# Patient Record
Sex: Female | Born: 1955 | Race: White | Hispanic: No | Marital: Married | Smoking: Former smoker
Health system: Southern US, Community
[De-identification: ages and names within clinical notes are randomized; demographics above are authoritative.]

## PROBLEM LIST (undated history)

## (undated) DIAGNOSIS — G4730 Sleep apnea, unspecified: Secondary | ICD-10-CM

## (undated) DIAGNOSIS — M1990 Unspecified osteoarthritis, unspecified site: Secondary | ICD-10-CM

## (undated) DIAGNOSIS — E119 Type 2 diabetes mellitus without complications: Secondary | ICD-10-CM

## (undated) DIAGNOSIS — G2581 Restless legs syndrome: Secondary | ICD-10-CM

## (undated) DIAGNOSIS — F32A Depression, unspecified: Secondary | ICD-10-CM

## (undated) DIAGNOSIS — I10 Essential (primary) hypertension: Secondary | ICD-10-CM

## (undated) DIAGNOSIS — D649 Anemia, unspecified: Secondary | ICD-10-CM

## (undated) DIAGNOSIS — F329 Major depressive disorder, single episode, unspecified: Secondary | ICD-10-CM

## (undated) DIAGNOSIS — E785 Hyperlipidemia, unspecified: Secondary | ICD-10-CM

## (undated) DIAGNOSIS — N12 Tubulo-interstitial nephritis, not specified as acute or chronic: Secondary | ICD-10-CM

## (undated) DIAGNOSIS — K219 Gastro-esophageal reflux disease without esophagitis: Secondary | ICD-10-CM

## (undated) HISTORY — PX: ORIF HUMERUS FRACTURE: SHX2126

## (undated) HISTORY — PX: APPENDECTOMY: SHX54

## (undated) HISTORY — PX: TONSILLECTOMY: SUR1361

## (undated) HISTORY — PX: ORIF WRIST FRACTURE: SHX2133

## (undated) HISTORY — PX: COLONOSCOPY: SHX174

## (undated) HISTORY — PX: LAPAROSCOPIC GASTRIC SLEEVE RESECTION: SHX5895

---

## 2003-09-18 ENCOUNTER — Other Ambulatory Visit: Payer: Self-pay

## 2004-06-03 ENCOUNTER — Other Ambulatory Visit: Payer: Self-pay

## 2004-08-09 ENCOUNTER — Ambulatory Visit: Payer: Self-pay | Admitting: Unknown Physician Specialty

## 2005-02-23 ENCOUNTER — Ambulatory Visit: Payer: Self-pay | Admitting: Internal Medicine

## 2005-03-12 ENCOUNTER — Ambulatory Visit: Payer: Self-pay | Admitting: Internal Medicine

## 2005-04-12 ENCOUNTER — Ambulatory Visit: Payer: Self-pay | Admitting: Internal Medicine

## 2005-06-16 ENCOUNTER — Ambulatory Visit: Payer: Self-pay

## 2006-07-03 ENCOUNTER — Ambulatory Visit: Payer: Self-pay | Admitting: Obstetrics and Gynecology

## 2007-07-05 ENCOUNTER — Ambulatory Visit: Payer: Self-pay | Admitting: Obstetrics and Gynecology

## 2008-01-11 ENCOUNTER — Ambulatory Visit: Payer: Self-pay | Admitting: Obstetrics and Gynecology

## 2008-08-11 ENCOUNTER — Ambulatory Visit: Payer: Self-pay | Admitting: Obstetrics and Gynecology

## 2009-08-13 ENCOUNTER — Ambulatory Visit: Payer: Self-pay | Admitting: Obstetrics and Gynecology

## 2010-09-09 ENCOUNTER — Ambulatory Visit: Payer: Self-pay | Admitting: Obstetrics and Gynecology

## 2010-09-17 ENCOUNTER — Ambulatory Visit: Payer: Self-pay | Admitting: Specialist

## 2010-10-01 ENCOUNTER — Ambulatory Visit: Payer: Self-pay | Admitting: Specialist

## 2010-10-13 ENCOUNTER — Ambulatory Visit: Payer: Self-pay | Admitting: Specialist

## 2010-11-05 ENCOUNTER — Ambulatory Visit: Payer: Self-pay | Admitting: Specialist

## 2010-11-16 ENCOUNTER — Inpatient Hospital Stay: Payer: Self-pay | Admitting: Specialist

## 2010-11-18 LAB — PATHOLOGY REPORT

## 2010-11-29 ENCOUNTER — Ambulatory Visit: Payer: Self-pay | Admitting: Specialist

## 2010-12-12 ENCOUNTER — Ambulatory Visit: Payer: Self-pay | Admitting: Specialist

## 2011-08-08 ENCOUNTER — Ambulatory Visit: Payer: Self-pay | Admitting: Unknown Physician Specialty

## 2011-09-14 ENCOUNTER — Ambulatory Visit: Payer: Self-pay

## 2012-10-17 ENCOUNTER — Ambulatory Visit: Payer: Self-pay | Admitting: Obstetrics and Gynecology

## 2013-11-06 ENCOUNTER — Ambulatory Visit: Payer: Self-pay | Admitting: Obstetrics and Gynecology

## 2014-12-01 ENCOUNTER — Ambulatory Visit: Payer: Self-pay | Admitting: Obstetrics and Gynecology

## 2015-05-28 ENCOUNTER — Encounter: Payer: Self-pay | Admitting: *Deleted

## 2015-05-28 ENCOUNTER — Inpatient Hospital Stay: Admission: RE | Admit: 2015-05-28 | Payer: Self-pay | Source: Ambulatory Visit

## 2015-05-28 NOTE — Patient Instructions (Addendum)
  Your procedure is scheduled on: 06-09-15 Report to [facility] 2ND FLOOR To find out your arrival time please call [phone] between 1PM - 3PM on 06-08-15  Remember: Instructions that are not followed completely may result in serious medical risk, up to and including death, or upon the discretion of your surgeon and anesthesiologist your surgery may need to be rescheduled.    _X___ 1. Do not eat food or drink liquids after midnight. No gum chewing or hard candies.     _X___ 2. No Alcohol for 24 hours before or after surgery.   ____ 3. Bring all medications with you on the day of surgery if instructed.    ____ 4. Notify your doctor if there is any change in your medical condition     (cold, fever, infections).     Do not wear jewelry, make-up, hairpins, clips or nail polish.  Do not wear lotions, powders, or perfumes. You may wear deodorant.  Do not shave 48 hours prior to surgery. Men may shave face and neck.  Do not bring valuables to the hospital.    [facility] is not responsible for any belongings or valuables.               Contacts, dentures or bridgework may not be worn into surgery.  Leave your suitcase in the car. After surgery it may be brought to your room.  For patients admitted to the hospital, discharge time is determined by your  treatment team.   Patients discharged the day of surgery will not be allowed to drive home.   Please read over the following fact sheets that you were given:     _X___ Take these medicines the morning of surgery with A SIP OF WATER:    1. LISINOPRIL  2. METOPROLOL  3.   4.  5.  6.  ____ Fleet Enema (as directed)   ____ Use CHG Soap as directed  ____ Use inhalers on the day of surgery  __X__ Stop metformin 2 days prior to surgery-LAST DOSE 06-06-15 SAT    ____ Take 1/2 of usual insulin dose the night before surgery and none on the morning of surgery.   __X__ Stop Coumadin/Plavix/aspirin-STOP ASA 7 DAYS  PRIOR  __X__ Stop Anti-inflammatories-STOP MELOXICAM 7 DAYS PRIOR-NO NSAIDS OR ASA PRODUCTS-TYLENOL OK   __X__ Stop supplements until after surgery-PT ALREADY STOPPED FISH OIL   ____ Bring C-Pap to the hospital.

## 2015-06-09 ENCOUNTER — Encounter: Payer: Self-pay | Admitting: *Deleted

## 2015-06-09 ENCOUNTER — Ambulatory Visit: Payer: BLUE CROSS/BLUE SHIELD | Admitting: Anesthesiology

## 2015-06-09 ENCOUNTER — Encounter
Admission: RE | Disposition: A | Discharge status: Home / Self Care | Payer: Self-pay | Source: Ambulatory Visit | Attending: Surgery

## 2015-06-09 ENCOUNTER — Ambulatory Visit
Admission: RE | Admit: 2015-06-09 | Discharge: 2015-06-09 | Disposition: A | Discharge status: Home / Self Care | Payer: BLUE CROSS/BLUE SHIELD | Source: Ambulatory Visit | Attending: Surgery | Admitting: Surgery

## 2015-06-09 DIAGNOSIS — K219 Gastro-esophageal reflux disease without esophagitis: Secondary | ICD-10-CM | POA: Diagnosis not present

## 2015-06-09 DIAGNOSIS — E119 Type 2 diabetes mellitus without complications: Secondary | ICD-10-CM | POA: Diagnosis not present

## 2015-06-09 DIAGNOSIS — M75102 Unspecified rotator cuff tear or rupture of left shoulder, not specified as traumatic: Secondary | ICD-10-CM | POA: Insufficient documentation

## 2015-06-09 DIAGNOSIS — G4730 Sleep apnea, unspecified: Secondary | ICD-10-CM | POA: Insufficient documentation

## 2015-06-09 DIAGNOSIS — Z8249 Family history of ischemic heart disease and other diseases of the circulatory system: Secondary | ICD-10-CM | POA: Diagnosis not present

## 2015-06-09 DIAGNOSIS — D649 Anemia, unspecified: Secondary | ICD-10-CM | POA: Insufficient documentation

## 2015-06-09 DIAGNOSIS — F329 Major depressive disorder, single episode, unspecified: Secondary | ICD-10-CM | POA: Diagnosis not present

## 2015-06-09 DIAGNOSIS — M7542 Impingement syndrome of left shoulder: Secondary | ICD-10-CM | POA: Diagnosis present

## 2015-06-09 DIAGNOSIS — M170 Bilateral primary osteoarthritis of knee: Secondary | ICD-10-CM | POA: Insufficient documentation

## 2015-06-09 DIAGNOSIS — Z79899 Other long term (current) drug therapy: Secondary | ICD-10-CM | POA: Diagnosis not present

## 2015-06-09 DIAGNOSIS — Z888 Allergy status to other drugs, medicaments and biological substances status: Secondary | ICD-10-CM | POA: Insufficient documentation

## 2015-06-09 DIAGNOSIS — Z800 Family history of malignant neoplasm of digestive organs: Secondary | ICD-10-CM | POA: Insufficient documentation

## 2015-06-09 DIAGNOSIS — Z833 Family history of diabetes mellitus: Secondary | ICD-10-CM | POA: Diagnosis not present

## 2015-06-09 DIAGNOSIS — Z87891 Personal history of nicotine dependence: Secondary | ICD-10-CM | POA: Insufficient documentation

## 2015-06-09 DIAGNOSIS — I10 Essential (primary) hypertension: Secondary | ICD-10-CM | POA: Insufficient documentation

## 2015-06-09 DIAGNOSIS — Z9884 Bariatric surgery status: Secondary | ICD-10-CM | POA: Insufficient documentation

## 2015-06-09 DIAGNOSIS — Z7982 Long term (current) use of aspirin: Secondary | ICD-10-CM | POA: Diagnosis not present

## 2015-06-09 DIAGNOSIS — E785 Hyperlipidemia, unspecified: Secondary | ICD-10-CM | POA: Diagnosis not present

## 2015-06-09 HISTORY — DX: Unspecified osteoarthritis, unspecified site: M19.90

## 2015-06-09 HISTORY — DX: Sleep apnea, unspecified: G47.30

## 2015-06-09 HISTORY — DX: Type 2 diabetes mellitus without complications: E11.9

## 2015-06-09 HISTORY — DX: Gastro-esophageal reflux disease without esophagitis: K21.9

## 2015-06-09 HISTORY — DX: Anemia, unspecified: D64.9

## 2015-06-09 HISTORY — DX: Hyperlipidemia, unspecified: E78.5

## 2015-06-09 HISTORY — DX: Essential (primary) hypertension: I10

## 2015-06-09 HISTORY — DX: Depression, unspecified: F32.A

## 2015-06-09 HISTORY — DX: Tubulo-interstitial nephritis, not specified as acute or chronic: N12

## 2015-06-09 HISTORY — DX: Major depressive disorder, single episode, unspecified: F32.9

## 2015-06-09 HISTORY — DX: Restless legs syndrome: G25.81

## 2015-06-09 HISTORY — PX: SHOULDER ARTHROSCOPY WITH OPEN ROTATOR CUFF REPAIR: SHX6092

## 2015-06-09 LAB — GLUCOSE, CAPILLARY
Glucose-Capillary: 83 mg/dL (ref 65–99)
Glucose-Capillary: 84 mg/dL (ref 65–99)

## 2015-06-09 SURGERY — ARTHROSCOPY, SHOULDER WITH REPAIR, ROTATOR CUFF, OPEN
Anesthesia: General | Site: Shoulder | Laterality: Left

## 2015-06-09 MED ORDER — LIDOCAINE HCL (CARDIAC) 20 MG/ML IV SOLN
INTRAVENOUS | Status: DC | PRN
  Administered 2015-06-09: 100 mg via INTRAVENOUS

## 2015-06-09 MED ORDER — FENTANYL CITRATE (PF) 100 MCG/2ML IJ SOLN
INTRAMUSCULAR | Status: DC | PRN
  Administered 2015-06-09: 150 ug via INTRAVENOUS
  Administered 2015-06-09: 100 ug via INTRAVENOUS

## 2015-06-09 MED ORDER — GLYCOPYRROLATE 0.2 MG/ML IJ SOLN
INTRAMUSCULAR | Status: DC | PRN
  Administered 2015-06-09: .5 mg via INTRAVENOUS

## 2015-06-09 MED ORDER — FAMOTIDINE 20 MG PO TABS
20.0000 mg | ORAL_TABLET | Freq: Once | ORAL | Status: AC
  Administered 2015-06-09: 20 mg via ORAL

## 2015-06-09 MED ORDER — LIDOCAINE HCL (PF) 1 % IJ SOLN
INTRAMUSCULAR | Status: AC
  Administered 2015-06-09: 5 mL
  Filled 2015-06-09: qty 5

## 2015-06-09 MED ORDER — ONDANSETRON HCL 4 MG/2ML IJ SOLN
4.0000 mg | Freq: Once | INTRAMUSCULAR | Status: AC
  Administered 2015-06-09: 4 mg via INTRAVENOUS

## 2015-06-09 MED ORDER — BUPIVACAINE-EPINEPHRINE 0.5% -1:200000 IJ SOLN
INTRAMUSCULAR | Status: DC | PRN
Start: 2015-06-09 — End: 2015-06-09
  Administered 2015-06-09: 20 mL

## 2015-06-09 MED ORDER — DEXAMETHASONE SODIUM PHOSPHATE 4 MG/ML IJ SOLN
INTRAMUSCULAR | Status: DC | PRN
  Administered 2015-06-09: 5 mg via INTRAVENOUS

## 2015-06-09 MED ORDER — PROPOFOL 10 MG/ML IV BOLUS
INTRAVENOUS | Status: DC | PRN
  Administered 2015-06-09: 50 mg via INTRAVENOUS

## 2015-06-09 MED ORDER — PROMETHAZINE HCL 25 MG/ML IJ SOLN: 6.2500 mg | INTRAMUSCULAR | Status: DC | PRN

## 2015-06-09 MED ORDER — ROPIVACAINE HCL 5 MG/ML IJ SOLN
INTRAMUSCULAR | Status: AC
  Administered 2015-06-09: 30 mL via EPIDURAL
  Filled 2015-06-09: qty 20

## 2015-06-09 MED ORDER — FENTANYL CITRATE (PF) 100 MCG/2ML IJ SOLN: 25.0000 ug | INTRAMUSCULAR | Status: DC | PRN

## 2015-06-09 MED ORDER — MIDAZOLAM HCL 2 MG/2ML IJ SOLN
2.0000 mg | Freq: Once | INTRAMUSCULAR | Status: AC
  Administered 2015-06-09: 2 mg via INTRAVENOUS

## 2015-06-09 MED ORDER — ONDANSETRON HCL 4 MG/2ML IJ SOLN
INTRAMUSCULAR | Status: AC
Start: 2015-06-09 — End: 2015-06-09
  Administered 2015-06-09: 4 mg via INTRAVENOUS
  Filled 2015-06-09: qty 2

## 2015-06-09 MED ORDER — OXYCODONE HCL 5 MG PO TABS: 5.0000 mg | ORAL_TABLET | ORAL | Status: AC | PRN

## 2015-06-09 MED ORDER — OXYCODONE HCL 5 MG PO TABS
5.0000 mg | ORAL_TABLET | Freq: Once | ORAL | Status: AC
Start: 2015-06-09 — End: 2015-06-09
  Administered 2015-06-09: 5 mg via ORAL

## 2015-06-09 MED ORDER — SODIUM CHLORIDE 0.9 % IV SOLN
INTRAVENOUS | Status: DC
Start: 2015-06-09 — End: 2015-06-09
  Administered 2015-06-09 (×2): via INTRAVENOUS

## 2015-06-09 MED ORDER — MIDAZOLAM HCL 5 MG/5ML IJ SOLN
INTRAMUSCULAR | Status: AC
Start: 2015-06-09 — End: 2015-06-09
  Administered 2015-06-09: 2 mg via INTRAVENOUS
  Filled 2015-06-09: qty 5

## 2015-06-09 MED ORDER — DEXTROSE 5 % IV SOLN
10.0000 mg | INTRAVENOUS | Status: DC | PRN
  Administered 2015-06-09: 30 ug/min via INTRAVENOUS

## 2015-06-09 MED ORDER — MIDAZOLAM HCL 2 MG/2ML IJ SOLN
INTRAMUSCULAR | Status: DC | PRN
  Administered 2015-06-09: 2 mg via INTRAVENOUS

## 2015-06-09 MED ORDER — CEFAZOLIN SODIUM-DEXTROSE 2-3 GM-% IV SOLR
2.0000 g | Freq: Once | INTRAVENOUS | Status: AC
  Administered 2015-06-09: 2 g via INTRAVENOUS

## 2015-06-09 MED ORDER — PHENYLEPHRINE HCL 10 MG/ML IJ SOLN
INTRAMUSCULAR | Status: DC | PRN
  Administered 2015-06-09: 200 ug via INTRAVENOUS
  Administered 2015-06-09: 150 ug via INTRAVENOUS
  Administered 2015-06-09: 100 ug via INTRAVENOUS

## 2015-06-09 MED ORDER — ROCURONIUM BROMIDE 100 MG/10ML IV SOLN
INTRAVENOUS | Status: DC | PRN
  Administered 2015-06-09: 50 mg via INTRAVENOUS

## 2015-06-09 MED ORDER — ONDANSETRON HCL 4 MG/2ML IJ SOLN
INTRAMUSCULAR | Status: DC | PRN
  Administered 2015-06-09: 4 mg via INTRAVENOUS

## 2015-06-09 MED ORDER — ACETAMINOPHEN 10 MG/ML IV SOLN
INTRAVENOUS | Status: DC | PRN
  Administered 2015-06-09: 1000 mg via INTRAVENOUS

## 2015-06-09 MED ORDER — EPINEPHRINE HCL 1 MG/ML IJ SOLN
INTRAMUSCULAR | Status: DC | PRN
  Administered 2015-06-09: 2 mL

## 2015-06-09 MED ORDER — NEOSTIGMINE METHYLSULFATE 10 MG/10ML IV SOLN
INTRAVENOUS | Status: DC | PRN
  Administered 2015-06-09: 3 mg via INTRAVENOUS

## 2015-06-09 SURGICAL SUPPLY — 47 items
ANCHOR JUGGERKNOT WTAP NDL 2.9 (Anchor) ×6 IMPLANT
BIT DRILL JUGRKNT W/NDL BIT2.9 (DRILL) ×1 IMPLANT
BLADE FULL RADIUS 3.5 (BLADE) ×3 IMPLANT
BLADE SHAVER 4.5X7 STR FR (MISCELLANEOUS) IMPLANT
BUR ACROMIONIZER 4.0 (BURR) ×3 IMPLANT
BUR BR 5.5 WIDE MOUTH (BURR) IMPLANT
CANNULA 8.5X75 THRED (CANNULA) ×3 IMPLANT
CANNULA SHAVER 8MMX76MM (CANNULA) ×6 IMPLANT
CHLORAPREP W/TINT 26ML (MISCELLANEOUS) ×6 IMPLANT
COVER MAYO STAND STRL (DRAPES) ×3 IMPLANT
DRAPE IMP U-DRAPE 54X76 (DRAPES) ×3 IMPLANT
DRAPE SURG 17X11 SM STRL (DRAPES) ×3 IMPLANT
DRILL JUGGERKNOT W/NDL BIT 2.9 (DRILL) ×3
DRSG OPSITE POSTOP 4X8 (GAUZE/BANDAGES/DRESSINGS) IMPLANT
GAUZE PETRO XEROFOAM 1X8 (MISCELLANEOUS) ×3 IMPLANT
GAUZE SPONGE 4X4 12PLY STRL (GAUZE/BANDAGES/DRESSINGS) ×3 IMPLANT
GLOVE BIO SURGEON STRL SZ7.5 (GLOVE) ×6 IMPLANT
GLOVE BIO SURGEON STRL SZ8 (GLOVE) ×6 IMPLANT
GLOVE BIOGEL PI IND STRL 8 (GLOVE) ×1 IMPLANT
GLOVE BIOGEL PI INDICATOR 8 (GLOVE) ×2
GLOVE INDICATOR 8.0 STRL GRN (GLOVE) ×3 IMPLANT
GOWN STRL REUS W/ TWL LRG LVL3 (GOWN DISPOSABLE) ×2 IMPLANT
GOWN STRL REUS W/ TWL XL LVL3 (GOWN DISPOSABLE) ×1 IMPLANT
GOWN STRL REUS W/TWL LRG LVL3 (GOWN DISPOSABLE) ×4
GOWN STRL REUS W/TWL XL LVL3 (GOWN DISPOSABLE) ×2
GRASPER SUT 15 45D LOW PRO (SUTURE) IMPLANT
IV LACTATED RINGER IRRG 3000ML (IV SOLUTION) ×4
IV LR IRRIG 3000ML ARTHROMATIC (IV SOLUTION) ×2 IMPLANT
MANIFOLD NEPTUNE II (INSTRUMENTS) ×3 IMPLANT
MASK FACE SPIDER DISP (MASK) ×3 IMPLANT
MAT BLUE FLOOR 46X72 FLO (MISCELLANEOUS) ×3 IMPLANT
NEEDLE REVERSE CUT 1/2 CRC (NEEDLE) ×3 IMPLANT
PACK ARTHROSCOPY SHOULDER (MISCELLANEOUS) ×3 IMPLANT
PAD GROUND ADULT SPLIT (MISCELLANEOUS) ×3 IMPLANT
SLING ARM LRG DEEP (SOFTGOODS) ×3 IMPLANT
SLING ULTRA II LG (MISCELLANEOUS) ×3 IMPLANT
STAPLER SKIN PROX 35W (STAPLE) ×3 IMPLANT
STRAP SAFETY BODY (MISCELLANEOUS) ×3 IMPLANT
SUT ETHIBOND 0 MO6 C/R (SUTURE) ×3 IMPLANT
SUT PROLENE 4 0 PS 2 18 (SUTURE) ×3 IMPLANT
SUT VIC AB 2-0 CT1 27 (SUTURE) ×4
SUT VIC AB 2-0 CT1 TAPERPNT 27 (SUTURE) ×2 IMPLANT
TAPE MICROFOAM 4IN (TAPE) ×3 IMPLANT
TUBING ARTHRO INFLOW-ONLY STRL (TUBING) ×3 IMPLANT
TUBING CONNECTING 10 (TUBING) ×2 IMPLANT
TUBING CONNECTING 10' (TUBING) ×1
WAND HAND CNTRL MULTIVAC 90 (MISCELLANEOUS) ×3 IMPLANT

## 2015-06-09 NOTE — Anesthesia Procedure Notes (Addendum)
Anesthesia Regional Block:  Interscalene brachial plexus block  Pre-Anesthetic Checklist: ,, timeout performed, Correct Patient, Correct Site, Correct Laterality, Correct Procedure, Correct Position, site marked, Risks and benefits discussed,  Surgical consent,  Pre-op evaluation,  At surgeon's request and post-op pain management  Laterality: Left and Upper  Prep: chloraprep       Needles:  Injection technique: Single-shot  Needle Type: Echogenic Stimulator Needle     Needle Length: 5cm 5 cm Needle Gauge: 22 and 22 G    Additional Needles:  Procedures: ultrasound guided (picture in chart) Interscalene brachial plexus block Narrative:  Start time: 06/09/2015 12:25 PM End time: 06/09/2015 12:30 PM Injection made incrementally with aspirations every 5 mL.  Performed by: Personally  Anesthesiologist: Lenard Simmer   Procedure Name: Intubation Date/Time: 06/09/2015 1:26 PM Performed by: Darrol Jump Pre-anesthesia Checklist: Patient identified, Emergency Drugs available, Suction available and Patient being monitored Patient Re-evaluated:Patient Re-evaluated prior to inductionOxygen Delivery Method: Circle system utilized Preoxygenation: Pre-oxygenation with 100% oxygen Intubation Type: IV induction Laryngoscope Size: Mac and 3 Grade View: Grade I Tube type: Oral Tube size: 7.0 mm Number of attempts: 1 Placement Confirmation: ETT inserted through vocal cords under direct vision,  positive ETCO2 and breath sounds checked- equal and bilateral Secured at: 21 cm Tube secured with: Tape Dental Injury: Teeth and Oropharynx as per pre-operative assessment

## 2015-06-09 NOTE — H&P (Signed)
Paper H&P to be scanned into permanent record. H&P reviewed. No changes. Lungs are clear to auscultation. Cardiac exam demonstrates a regular rate and rhythm with no murmurs.

## 2015-06-09 NOTE — Op Note (Signed)
06/09/2015  2:56 PM  Patient:   Cassandra Schwartz  Pre-Op Diagnosis:   Impingement/tendinopathy with near full-thickness rotator cuff tear, left shoulder.  Postoperative diagnosis: Impingement/tendinopathy with near full-thickness rotator cuff tear and labral fraying, left shoulder.  Procedure: Limited arthroscopic debridement, arthroscopic subacromial decompression, and mini-open rotator cuff repair, left shoulder.  Anesthesia: General endotracheal with interscalene block placed preoperatively by the anesthesiologist.  Surgeon:   Maryagnes Amos, MD  Assistant:   Horris Latino, PA-C  Findings: As above. There was moderate fraying involving the superior and postero-superior portions of the labrum without frank detachment. The biceps tendon was in satisfactory condition. There was a near full-thickness bursal surface tear of the supraspinatus involving at least 90% of the footprint and extending into the infraspinatus with intact articular surface fibers. The articular surfaces of both the glenoid and humerus were in satisfactory condition.  Complications: None  Fluids:   1100 cc  Estimated blood loss: 10 cc  Tourniquet time: None  Drains: None  Closure: Staples   Brief clinical note: The patient is a 59 year old female who fell onto her left shoulder 5 months ago, resulting in pain and weakness of the left shoulder. The patient's symptoms have persisted despite medications, activity modification, etc. The patient's history and examination are consistent with a rotator cuff tear. These findings were confirmed by MRI scan. The patient presents at this time for definitive management of these shoulder symptoms.  Procedure: The patient underwent placement of an interscalene block by the anesthesiologist in the preoperative holding area. The patient was brought into the operating room and lain in the supine position. She underwent general endotracheal intubation and  anesthesia before being repositioned in the beach chair position using the beach chair positioner. The left shoulder and upper extremity were prepped with ChloraPrep solution before being draped sterilely. Preoperative antibiotics were administered. A timeout was performed to confirm the proper surgical site before the expected portal sites and incision site were injected with 0.5% Sensorcaine with epinephrine. A posterior portal was created and the glenohumeral joint thoroughly inspected with the findings as described above. An anterior portal was created using an outside-in technique. The labrum and rotator cuff were further probed, again confirming the above-noted findings. The areas of labral fraying superiorly were debrided back to stable margins using the full-radius resector. Some areas of reactive synovitis also were debrided. There is no evidence of any rotator cuff tear when viewed from the intra-articular side. The ArthroCare wand was inserted and used to obtain hemostasis as well as to "anneal" the labrum superiorly and anteriorly. The instruments were removed from the joint after suctioning the excess fluid.  The camera was repositioned through the posterior portal into the subacromial space. A separate lateral portal was created using an outside-in technique. The 3.5 mm full-radius resector was introduced and used to perform a subtotal bursectomy. The ArthroCare wand was then inserted and used to remove the periosteal tissue off the undersurface of the anterior third of the acromion as well as to recess the coracoacromial ligament from its attachment along the anterior and lateral margins of the acromion. The 4.0 mm acromionizing bur was introduced and used to complete the decompression by removing the undersurface of the anterior third of the acromion. The full radius resector was reintroduced to remove any residual bony debris before the ArthroCare wand was reintroduced to obtain hemostasis. The  instruments were then removed from the subacromial space after suctioning the excess fluid.  An approximately 3.5-4 cm incision was  made over the anterolateral aspect of the shoulder beginning at the anterolateral corner of the acromion and extending distally in line with the bicipital groove. This incision was carried down through the subcutaneous tissues to expose the deltoid fascia. The raphae between the anterior and middle thirds was identified and this plane developed to provide access into the subacromial space. Additional bursal tissues were debrided sharply using Metzenbaum scissors. The rotator cuff tear was readily identified. The margins were debrided sharply with a #15 blade and the exposed greater tuberosity roughened with a rongeur while keeping the articular fibers intact. The tear was repaired using two Biomet 2.9 mm JuggerKnot anchors. Several of these sutures were then brought back laterally through bone tunnels and tied over bone bridges to create a two-layer closure. Several additional #0 Ethibond interrupted sutures were placed in a side-to-side fashion to further reinforce the repair. An apparent watertight closure was obtained.  The wound was copiously irrigated with sterile saline solution before the deltoid raphae was reapproximated using 2-0 Vicryl interrupted sutures. The subcutaneous tissues were closed in two layers using 2-0 Vicryl interrupted sutures before the skin was closed using staples. The portal sites also were closed using staples. A sterile bulky dressing was applied to the shoulder before the arm was placed into a shoulder immobilizer. The patient was then awakened, extubated, and returned to the recovery room in satisfactory condition after tolerating the procedure well.

## 2015-06-09 NOTE — Anesthesia Preprocedure Evaluation (Signed)
Anesthesia Evaluation  Patient identified by MRN, date of birth, ID band Patient awake    Reviewed: Allergy & Precautions, H&P , NPO status , Patient's Chart, lab work & pertinent test results, reviewed documented beta blocker date and time   History of Anesthesia Complications Negative for: history of anesthetic complications  Airway Mallampati: III  TM Distance: >3 FB Neck ROM: full    Dental no notable dental hx. (+) Caps, Teeth Intact Permanent bridge on the bottom left:   Pulmonary neg shortness of breath, sleep apnea (history of, but not rechecked since 90 pound weight loss) , neg COPD, neg recent URI, former smoker,    Pulmonary exam normal breath sounds clear to auscultation       Cardiovascular Exercise Tolerance: Good hypertension, On Medications and On Home Beta Blockers (-) angina(-) CAD, (-) Past MI, (-) Cardiac Stents and (-) CABG Normal cardiovascular exam(-) dysrhythmias (-) Valvular Problems/Murmurs Rhythm:regular Rate:Normal     Neuro/Psych PSYCHIATRIC DISORDERS (depression) negative neurological ROS     GI/Hepatic Neg liver ROS, GERD  Medicated and Controlled,  Endo/Other  diabetes, Well Controlled, Oral Hypoglycemic Agents  Renal/GU negative Renal ROS  negative genitourinary   Musculoskeletal   Abdominal   Peds  Hematology negative hematology ROS (+)   Anesthesia Other Findings Past Medical History:   Hypertension                                                 Diabetes mellitus without complication                       Pyelonephritis                                               Depression                                                   Anemia                                                       Restless leg syndrome                                        Arthritis                                                    GERD (gastroesophageal reflux disease)                       Hyperlipidemia  Sleep apnea                                                    Comment:NO CPAP- PT HAD GASRTIC SLEEVE IN 2012 AND HAS               NOT BEEN RECHECKED    Reproductive/Obstetrics negative OB ROS                             Anesthesia Physical Anesthesia Plan  ASA: III  Anesthesia Plan: General   Post-op Pain Management:    Induction:   Airway Management Planned:   Additional Equipment:   Intra-op Plan:   Post-operative Plan:   Informed Consent: I have reviewed the patients History and Physical, chart, labs and discussed the procedure including the risks, benefits and alternatives for the proposed anesthesia with the patient or authorized representative who has indicated his/her understanding and acceptance.   Dental Advisory Given  Plan Discussed with: Anesthesiologist, CRNA and Surgeon  Anesthesia Plan Comments:         Anesthesia Quick Evaluation

## 2015-06-09 NOTE — Discharge Instructions (Signed)
Keep dressing dry and intact.  °May shower after dressing changed on post-op day #4 (Saturday).  °Cover staples with Band-Aids after drying off. °Apply ice frequently to shoulder. °Keep shoulder immobilizer on at all times except may remove for bathing purposes. °Follow-up in 10-14 days or as scheduled. °

## 2015-06-09 NOTE — Transfer of Care (Signed)
Immediate Anesthesia Transfer of Care Note  Patient: Cassandra Schwartz  Procedure(s) Performed: Procedure(s): left shoulder arthroscopic debridement, decompression, WITH OPEN ROTATOR CUFF REPAIR (Left)  Patient Location: PACU  Anesthesia Type:General  Level of Consciousness: awake, alert , oriented and patient cooperative  Airway & Oxygen Therapy: Patient Spontanous Breathing and Patient connected to nasal cannula oxygen  Post-op Assessment: Report given to RN and Post -op Vital signs reviewed and stable  Post vital signs: Reviewed and stable  Last Vitals:  Filed Vitals:   06/09/15 1515  BP: 148/95  Pulse:   Temp: 36.4 C  Resp: 17    Complications: No apparent anesthesia complications

## 2015-06-10 ENCOUNTER — Encounter: Payer: Self-pay | Admitting: Surgery

## 2015-06-10 NOTE — Anesthesia Postprocedure Evaluation (Signed)
  Anesthesia Post-op Note  Patient: Cassandra Schwartz  Procedure(s) Performed: Procedure(s): left shoulder arthroscopic debridement, decompression, WITH OPEN ROTATOR CUFF REPAIR (Left)  Anesthesia type:General  Patient location: PACU  Post pain: Pain level controlled  Post assessment: Post-op Vital signs reviewed, Patient's Cardiovascular Status Stable, Respiratory Function Stable, Patent Airway and No signs of Nausea or vomiting  Post vital signs: Reviewed and stable  Last Vitals:  Filed Vitals:   06/09/15 1659  BP: 134/46  Pulse:   Temp: 35.8 C  Resp: 16    Level of consciousness: awake, alert  and patient cooperative  Complications: No apparent anesthesia complications

## 2016-03-30 ENCOUNTER — Other Ambulatory Visit: Payer: Self-pay | Admitting: Obstetrics and Gynecology

## 2016-03-30 DIAGNOSIS — Z1231 Encounter for screening mammogram for malignant neoplasm of breast: Secondary | ICD-10-CM

## 2016-05-05 ENCOUNTER — Ambulatory Visit: Payer: BLUE CROSS/BLUE SHIELD

## 2016-05-12 ENCOUNTER — Ambulatory Visit
Admission: RE | Admit: 2016-05-12 | Discharge: 2016-05-12 | Disposition: A | Discharge status: Home / Self Care | Payer: Managed Care, Other (non HMO) | Source: Ambulatory Visit | Attending: Obstetrics and Gynecology | Admitting: Obstetrics and Gynecology

## 2016-05-12 DIAGNOSIS — Z1231 Encounter for screening mammogram for malignant neoplasm of breast: Secondary | ICD-10-CM | POA: Insufficient documentation

## 2017-02-13 ENCOUNTER — Other Ambulatory Visit: Payer: Self-pay | Admitting: Obstetrics and Gynecology

## 2017-02-13 DIAGNOSIS — Z1231 Encounter for screening mammogram for malignant neoplasm of breast: Secondary | ICD-10-CM

## 2017-05-16 ENCOUNTER — Ambulatory Visit
Admission: RE | Admit: 2017-05-16 | Discharge: 2017-05-16 | Disposition: A | Discharge status: Home / Self Care | Payer: 59 | Source: Ambulatory Visit | Attending: Obstetrics and Gynecology | Admitting: Obstetrics and Gynecology

## 2017-05-16 DIAGNOSIS — Z1231 Encounter for screening mammogram for malignant neoplasm of breast: Secondary | ICD-10-CM | POA: Diagnosis not present

## 2018-05-15 ENCOUNTER — Other Ambulatory Visit: Payer: Self-pay | Admitting: Obstetrics and Gynecology

## 2018-05-15 DIAGNOSIS — Z1231 Encounter for screening mammogram for malignant neoplasm of breast: Secondary | ICD-10-CM

## 2018-09-06 ENCOUNTER — Ambulatory Visit
Admission: RE | Admit: 2018-09-06 | Discharge: 2018-09-06 | Disposition: A | Discharge status: Home / Self Care | Payer: Managed Care, Other (non HMO) | Source: Ambulatory Visit | Attending: Obstetrics and Gynecology | Admitting: Obstetrics and Gynecology

## 2018-09-06 ENCOUNTER — Encounter (INDEPENDENT_AMBULATORY_CARE_PROVIDER_SITE_OTHER): Payer: Self-pay

## 2018-09-06 DIAGNOSIS — Z1231 Encounter for screening mammogram for malignant neoplasm of breast: Secondary | ICD-10-CM | POA: Insufficient documentation

## 2019-08-19 ENCOUNTER — Other Ambulatory Visit: Payer: Self-pay | Admitting: Obstetrics and Gynecology

## 2019-08-19 DIAGNOSIS — Z1231 Encounter for screening mammogram for malignant neoplasm of breast: Secondary | ICD-10-CM

## 2019-09-10 ENCOUNTER — Other Ambulatory Visit: Payer: Self-pay

## 2019-09-10 ENCOUNTER — Ambulatory Visit
Admission: RE | Admit: 2019-09-10 | Discharge: 2019-09-10 | Disposition: A | Discharge status: Home / Self Care | Payer: Managed Care, Other (non HMO) | Source: Ambulatory Visit | Attending: Obstetrics and Gynecology | Admitting: Obstetrics and Gynecology

## 2019-09-10 DIAGNOSIS — Z1231 Encounter for screening mammogram for malignant neoplasm of breast: Secondary | ICD-10-CM | POA: Insufficient documentation

## 2020-08-27 ENCOUNTER — Other Ambulatory Visit: Payer: Self-pay | Admitting: Obstetrics and Gynecology

## 2020-08-27 DIAGNOSIS — Z1231 Encounter for screening mammogram for malignant neoplasm of breast: Secondary | ICD-10-CM

## 2020-09-10 ENCOUNTER — Other Ambulatory Visit: Payer: Self-pay

## 2020-09-10 ENCOUNTER — Ambulatory Visit: Admission: RE | Admit: 2020-09-10 | Payer: BLUE CROSS/BLUE SHIELD | Source: Ambulatory Visit

## 2021-07-30 IMAGING — MG DIGITAL SCREENING BILAT W/ TOMO W/ CAD
8 series · 8 of 24 positions shown · non-contrast
Comparison: Previous exam(s).

ACR Breast Density Category a: The breast tissue is almost entirely
fatty.

CLINICAL DATA: Screening.

EXAM:
DIGITAL SCREENING BILATERAL MAMMOGRAM WITH TOMO AND CAD

[L CC synth-2D]
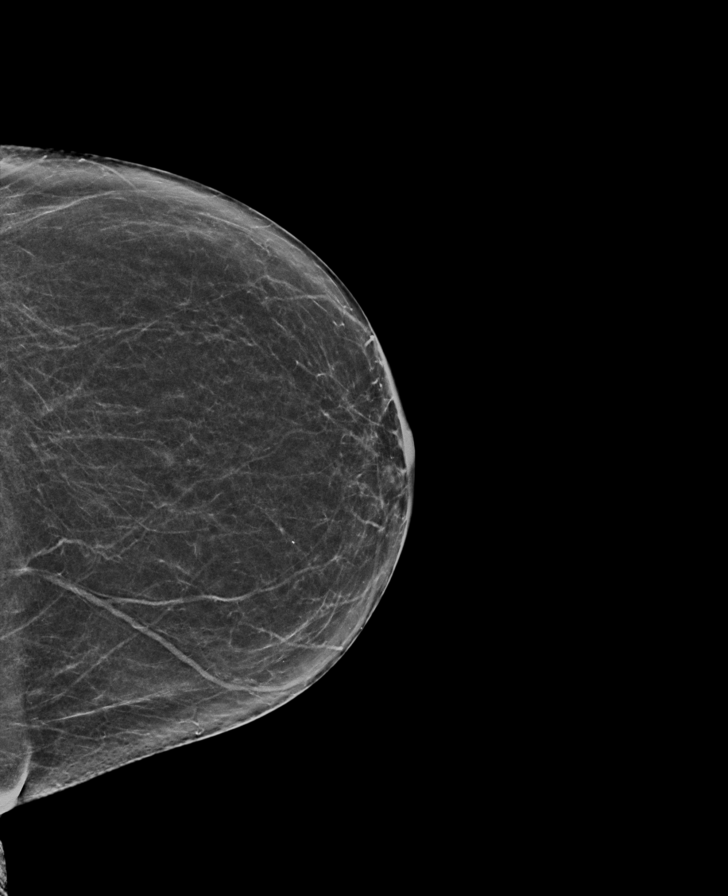

[R CC synth-2D]
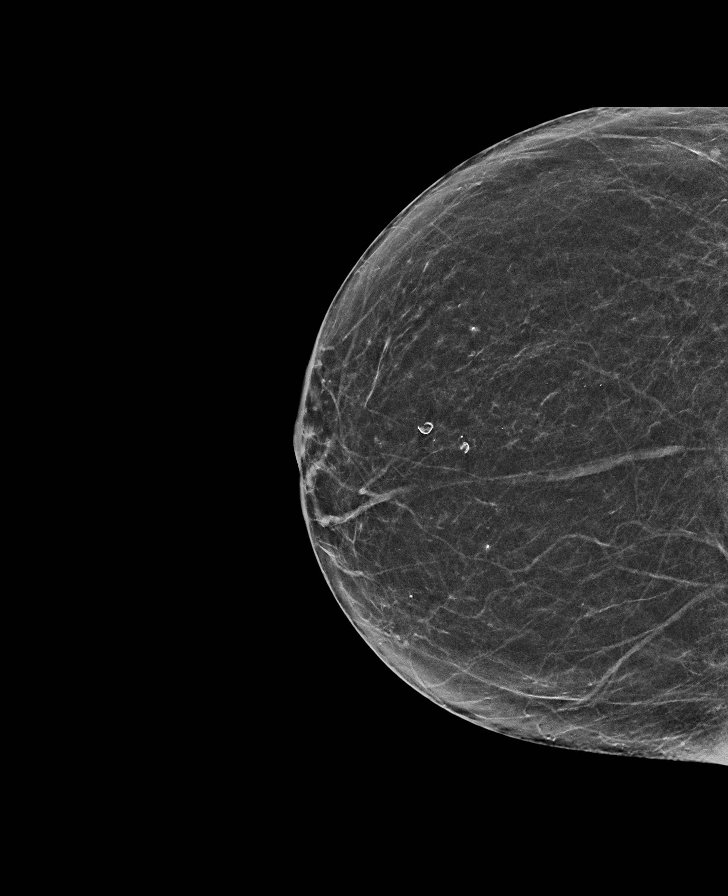

[L MLO synth-2D]
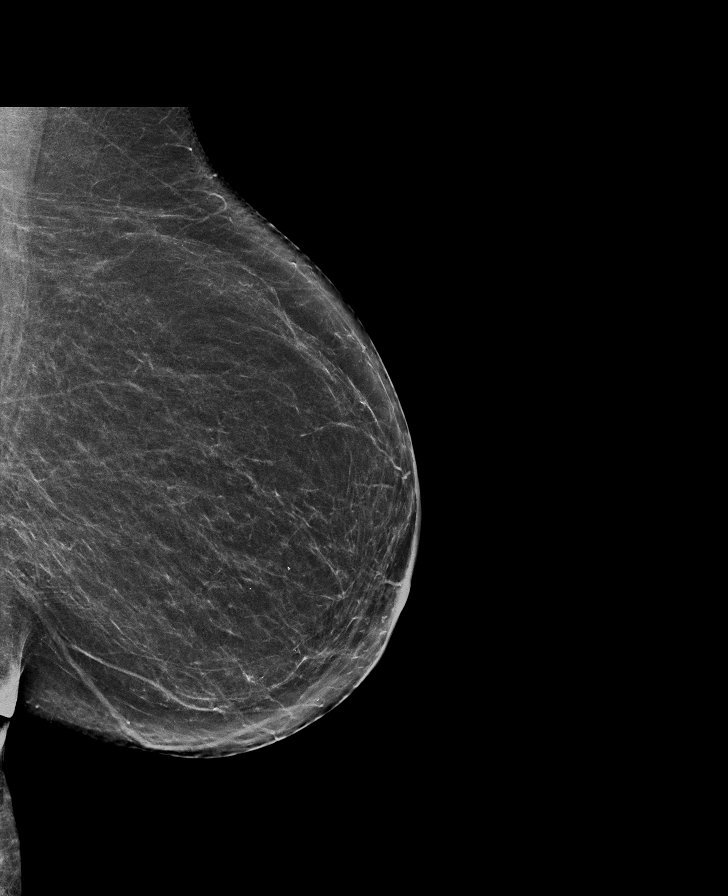

[R MLO synth-2D]
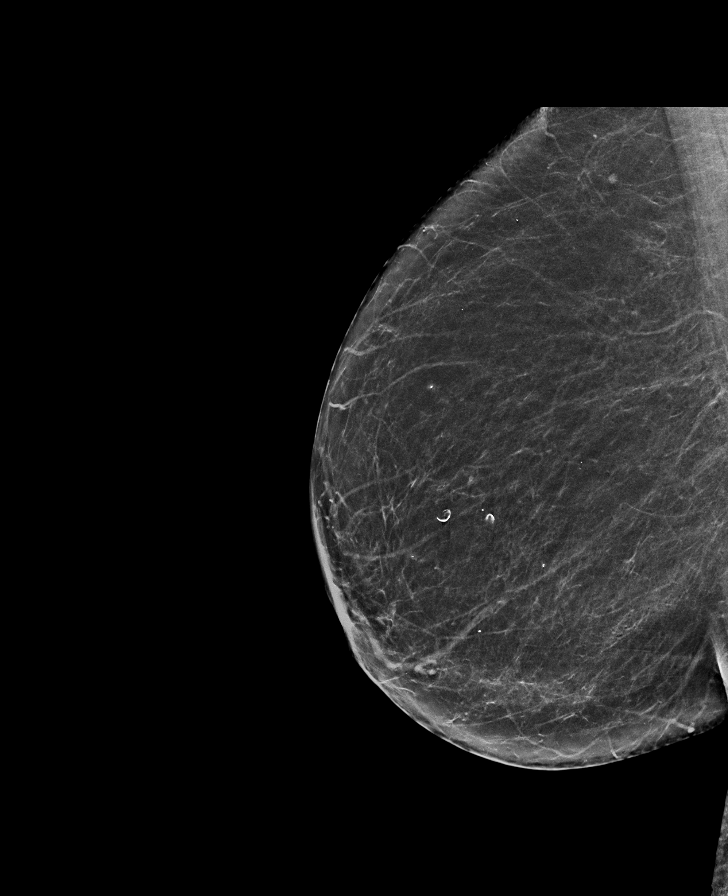

[L CC tomo · tomo slice 31/61.0]
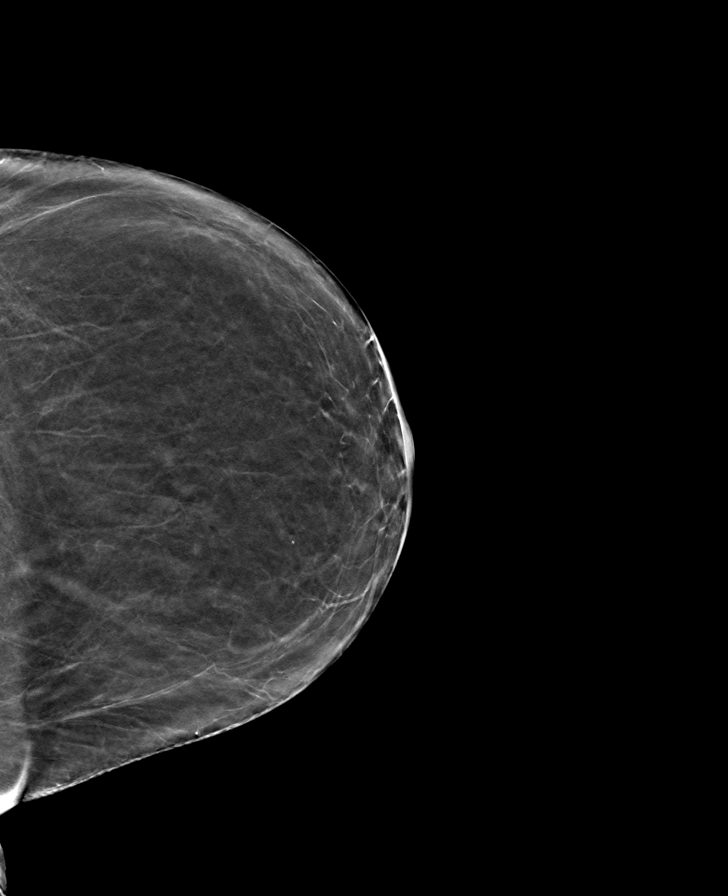

[R CC tomo · tomo slice 30/59.0]
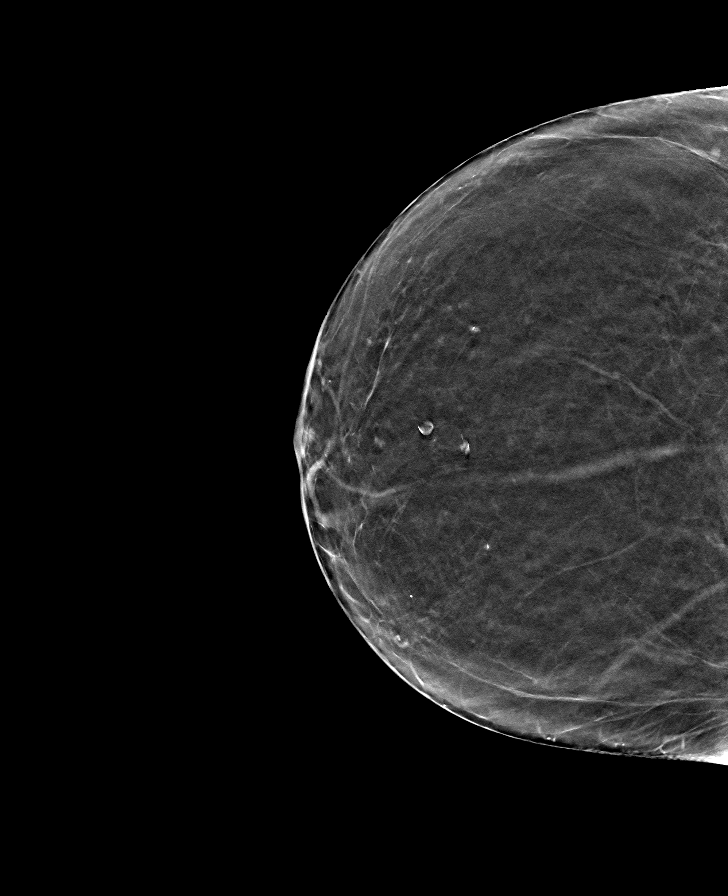

[R MLO tomo · tomo slice 33/66.0]
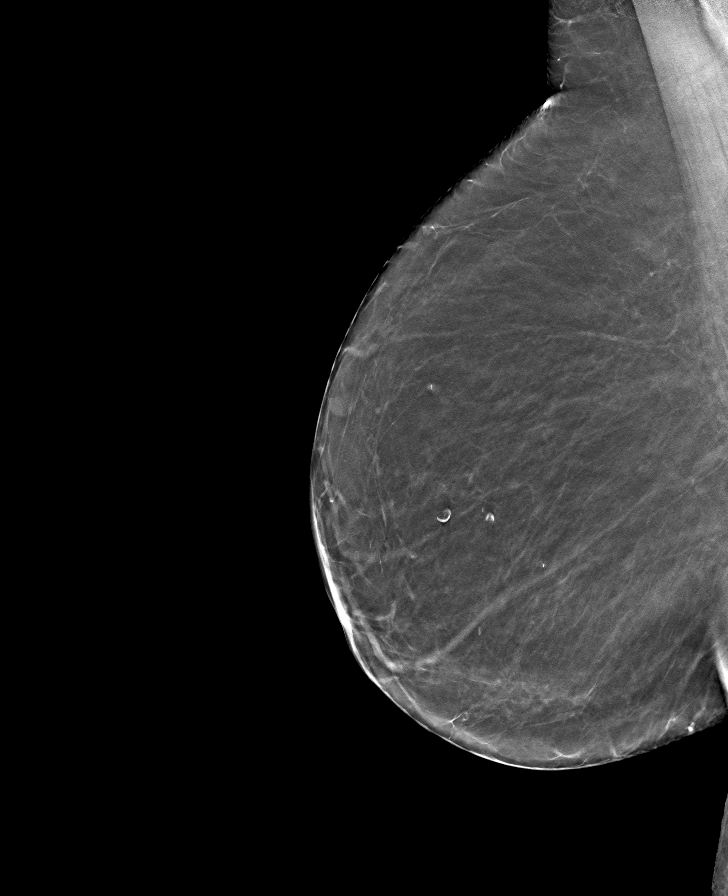

[L MLO tomo · tomo slice 35/69.0]
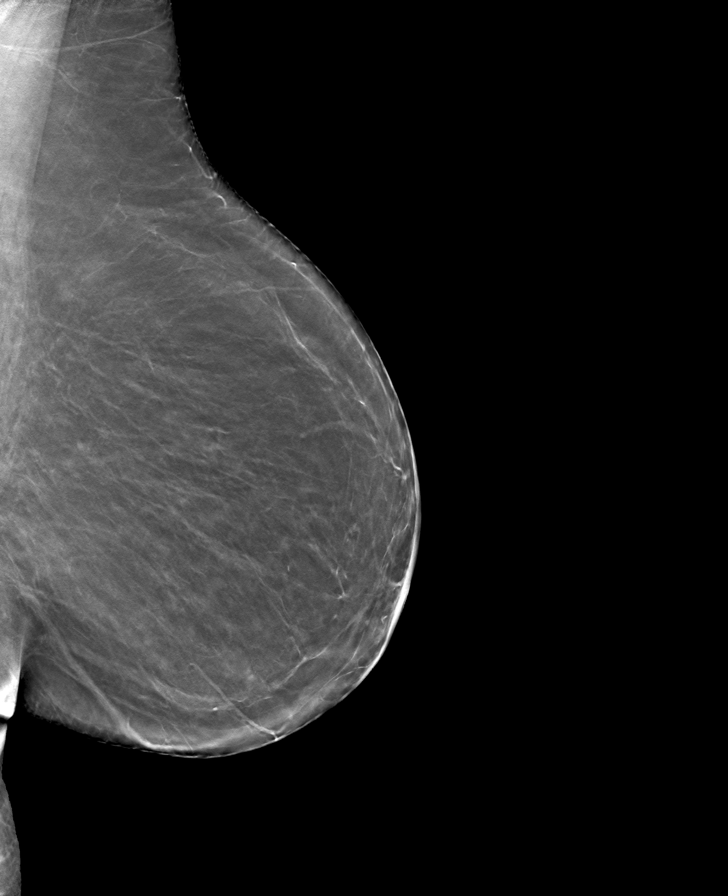

[8 of 24 positions shown; findings below may reference images not displayed]

FINDINGS: There are no findings suspicious for malignancy. Images were
processed with CAD.
IMPRESSION: No mammographic evidence of malignancy. A result letter of this
screening mammogram will be mailed directly to the patient.

RECOMMENDATION:
Screening mammogram in one year. (Code:8Y-[id number])

BI-RADS CATEGORY  1: Negative.

## 2021-08-18 ENCOUNTER — Other Ambulatory Visit: Payer: Self-pay | Admitting: Obstetrics and Gynecology

## 2021-08-18 DIAGNOSIS — Z1231 Encounter for screening mammogram for malignant neoplasm of breast: Secondary | ICD-10-CM

## 2022-05-20 ENCOUNTER — Ambulatory Visit: Payer: Medicare Other | Admitting: Certified Registered Nurse Anesthetist

## 2022-05-20 ENCOUNTER — Ambulatory Visit
Admission: RE | Admit: 2022-05-20 | Discharge: 2022-05-20 | Disposition: A | Discharge status: Home / Self Care | Payer: Medicare Other | Attending: Gastroenterology | Admitting: Gastroenterology

## 2022-05-20 ENCOUNTER — Other Ambulatory Visit: Payer: Self-pay

## 2022-05-20 ENCOUNTER — Encounter
Admission: RE | Disposition: A | Discharge status: Home / Self Care | Payer: Self-pay | Source: Home / Self Care | Attending: Gastroenterology

## 2022-05-20 ENCOUNTER — Encounter: Payer: Self-pay | Admitting: *Deleted

## 2022-05-20 DIAGNOSIS — E785 Hyperlipidemia, unspecified: Secondary | ICD-10-CM | POA: Insufficient documentation

## 2022-05-20 DIAGNOSIS — Z1211 Encounter for screening for malignant neoplasm of colon: Secondary | ICD-10-CM | POA: Diagnosis present

## 2022-05-20 DIAGNOSIS — I10 Essential (primary) hypertension: Secondary | ICD-10-CM | POA: Insufficient documentation

## 2022-05-20 DIAGNOSIS — K6389 Other specified diseases of intestine: Secondary | ICD-10-CM | POA: Insufficient documentation

## 2022-05-20 DIAGNOSIS — Z9884 Bariatric surgery status: Secondary | ICD-10-CM | POA: Insufficient documentation

## 2022-05-20 DIAGNOSIS — G4730 Sleep apnea, unspecified: Secondary | ICD-10-CM | POA: Insufficient documentation

## 2022-05-20 DIAGNOSIS — E119 Type 2 diabetes mellitus without complications: Secondary | ICD-10-CM | POA: Insufficient documentation

## 2022-05-20 HISTORY — PX: COLONOSCOPY WITH PROPOFOL: SHX5780

## 2022-05-20 LAB — GLUCOSE, CAPILLARY: Glucose-Capillary: 108 mg/dL — ABNORMAL HIGH (ref 70–99)

## 2022-05-20 SURGERY — COLONOSCOPY WITH PROPOFOL
Anesthesia: General

## 2022-05-20 MED ORDER — EPHEDRINE SULFATE (PRESSORS) 50 MG/ML IJ SOLN
INTRAMUSCULAR | Status: DC | PRN
  Administered 2022-05-20 (×5): 5 mg via INTRAVENOUS

## 2022-05-20 MED ORDER — SODIUM CHLORIDE 0.9 % IV SOLN: INTRAVENOUS | Status: DC

## 2022-05-20 MED ORDER — PROPOFOL 10 MG/ML IV BOLUS
INTRAVENOUS | Status: DC | PRN
  Administered 2022-05-20: 60 mg via INTRAVENOUS

## 2022-05-20 MED ORDER — EPHEDRINE 5 MG/ML INJ
INTRAVENOUS | Status: AC
  Filled 2022-05-20: qty 5

## 2022-05-20 MED ORDER — PROPOFOL 500 MG/50ML IV EMUL
INTRAVENOUS | Status: DC | PRN
  Administered 2022-05-20: 150 ug/kg/min via INTRAVENOUS

## 2022-05-20 MED ORDER — PROPOFOL 1000 MG/100ML IV EMUL
INTRAVENOUS | Status: AC
  Filled 2022-05-20: qty 100

## 2022-05-20 NOTE — Anesthesia Postprocedure Evaluation (Signed)
Anesthesia Post Note  Patient: KYLEA BERRONG  Procedure(s) Performed: COLONOSCOPY WITH PROPOFOL  Patient location during evaluation: Endoscopy Anesthesia Type: General Level of consciousness: awake and alert Pain management: pain level controlled Vital Signs Assessment: post-procedure vital signs reviewed and stable Respiratory status: spontaneous breathing, nonlabored ventilation, respiratory function stable and patient connected to nasal cannula oxygen Cardiovascular status: blood pressure returned to baseline and stable Postop Assessment: no apparent nausea or vomiting Anesthetic complications: no   No notable events documented.   Last Vitals:  Vitals:   05/20/22 0930 05/20/22 0935  BP:  132/68  Pulse: (!) 54 (!) 53  Resp: 15 13  Temp:    SpO2: 100% 98%    Last Pain:  Vitals:   05/20/22 0910  TempSrc: Temporal  PainSc:                  Cleda Mccreedy Rapheal Masso

## 2022-05-20 NOTE — H&P (Signed)
Outpatient short stay form Pre-procedure 05/20/2022  Regis Bill, MD  Primary Physician: Marina Goodell, MD  Reason for visit:  Screening  History of present illness:    66 y/o lady with history of DM II, hypertension, and HLD here for screening colonoscopy. Last colonoscopy in 2012 was normal. No blood thinners. No family history of GI malignancies. History of gastric sleeve.    Current Facility-Administered Medications:    0.9 %  sodium chloride infusion, , Intravenous, Continuous, Roch Quach, Rossie Muskrat, MD  Medications Prior to Admission  Medication Sig Dispense Refill Last Dose   lisinopril (PRINIVIL,ZESTRIL) 2.5 MG tablet Take 2.5 mg by mouth every morning.    05/20/2022   metoprolol tartrate (LOPRESSOR) 25 MG tablet Take 25 mg by mouth 2 (two) times daily.   05/20/2022   aspirin 81 MG tablet Take 81 mg by mouth daily.      atorvastatin (LIPITOR) 40 MG tablet Take 40 mg by mouth daily at 6 PM.       Cholecalciferol (VITAMIN D3) 5000 UNITS CAPS Take 1 capsule by mouth daily.      citalopram (CELEXA) 40 MG tablet Take 40 mg by mouth at bedtime.       Dulaglutide (TRULICITY) 1.5 MG/0.5ML SOPN Inject 1 Dose into the skin every 7 (seven) days. EVERY SUNDAY (Patient not taking: Reported on 05/20/2022)   Not Taking   hydrochlorothiazide (MICROZIDE) 12.5 MG capsule Take 12.5 mg by mouth daily.      meloxicam (MOBIC) 15 MG tablet Take 15 mg by mouth daily.      metFORMIN (GLUCOPHAGE) 1000 MG tablet Take 1,000 mg by mouth 2 (two) times daily with a meal.      Omega-3 Fatty Acids (OMEGA-3 FISH OIL PO) Take 1 tablet by mouth daily.      oxyCODONE (ROXICODONE) 5 MG immediate release tablet Take 1-2 tablets (5-10 mg total) by mouth every 4 (four) hours as needed for severe pain. 60 tablet 0    pramipexole (MIRAPEX) 0.5 MG tablet Take 0.5 mg by mouth at bedtime.        Allergies  Allergen Reactions   Accupril [Quinapril Hcl]    Protonix [Pantoprazole]    Verapamil Other (See Comments)     FATIGUE     Past Medical History:  Diagnosis Date   Anemia    Arthritis    Depression    Diabetes mellitus without complication (HCC)    GERD (gastroesophageal reflux disease)    Hyperlipidemia    Hypertension    Pyelonephritis    Restless leg syndrome    Sleep apnea    NO CPAP- PT HAD GASRTIC SLEEVE IN 2012 AND HAS NOT BEEN RECHECKED     Review of systems:  Otherwise negative.    Physical Exam  Gen: Alert, oriented. Appears stated age.  HEENT: PERRLA. Lungs: No respiratory distress CV: RRR Abd: soft, benign, no masses Ext: No edema    Planned procedures: Proceed with colonoscopy. The patient understands the nature of the planned procedure, indications, risks, alternatives and potential complications including but not limited to bleeding, infection, perforation, damage to internal organs and possible oversedation/side effects from anesthesia. The patient agrees and gives consent to proceed.  Please refer to procedure notes for findings, recommendations and patient disposition/instructions.     Regis Bill, MD [facility]

## 2022-05-20 NOTE — Anesthesia Procedure Notes (Signed)
Date/Time: 05/20/2022 9:00 AM  Performed by: Malva Cogan, CRNAPre-anesthesia Checklist: Patient identified, Emergency Drugs available, Suction available, Patient being monitored and Timeout performed Patient Re-evaluated:Patient Re-evaluated prior to induction Oxygen Delivery Method: Nasal cannula Induction Type: IV induction Placement Confirmation: CO2 detector and positive ETCO2

## 2022-05-20 NOTE — Op Note (Signed)
[facility] Gastroenterology Patient Name: Cassandra Schwartz Procedure Date: 05/20/2022 8:25 AM MRN: [id number] Account #: [id number] Date of Birth: 1955/10/19 Admit Type: Outpatient Age: 66 Room: [facility] ENDO ROOM 3 Gender: Female Note Status: Finalized Instrument Name: Park Meo [id number] Procedure:             Colonoscopy Indications:           Screening for colorectal malignant neoplasm Providers:             Andrey Farmer MD, MD Referring MD:          Sofie Hartigan (Referring MD) Medicines:             Monitored Anesthesia Care Complications:         No immediate complications. Estimated blood loss:                         Minimal. Procedure:             Pre-Anesthesia Assessment:                        - Prior to the procedure, a History and Physical was                         performed, and patient medications and allergies were                         reviewed. The patient is competent. The risks and                         benefits of the procedure and the sedation options and                         risks were discussed with the patient. All questions                         were answered and informed consent was obtained.                         Patient identification and proposed procedure were                         verified by the physician, the nurse, the                         anesthesiologist, the anesthetist and the technician                         in the endoscopy suite. Mental Status Examination:                         alert and oriented. Airway Examination: normal                         oropharyngeal airway and neck mobility. Respiratory                         Examination: clear to auscultation. CV Examination:  normal. Prophylactic Antibiotics: The patient does not                         require prophylactic antibiotics. Prior                         Anticoagulants: The patient has taken no previous                          anticoagulant or antiplatelet agents. ASA Grade                         Assessment: II - A patient with mild systemic disease.                         After reviewing the risks and benefits, the patient                         was deemed in satisfactory condition to undergo the                         procedure. The anesthesia plan was to use monitored                         anesthesia care (MAC). Immediately prior to                         administration of medications, the patient was                         re-assessed for adequacy to receive sedatives. The                         heart rate, respiratory rate, oxygen saturations,                         blood pressure, adequacy of pulmonary ventilation, and                         response to care were monitored throughout the                         procedure. The physical status of the patient was                         re-assessed after the procedure.                        After obtaining informed consent, the colonoscope was                         passed under direct vision. Throughout the procedure,                         the patient's blood pressure, pulse, and oxygen                         saturations were monitored continuously. The  Colonoscope was introduced through the anus and                         advanced to the the cecum, identified by appendiceal                         orifice and ileocecal valve. The colonoscopy was                         performed without difficulty. The patient tolerated                         the procedure well. The quality of the bowel                         preparation was good except the cecum was fair. Findings:      The perianal and digital rectal examinations were normal.      A diffuse area of mildly erythematous mucosa was found in the sigmoid       colon. Biopsies were taken with a cold forceps for histology. Estimated       blood loss was  minimal.      The exam was otherwise without abnormality on direct and retroflexion       views. Impression:            - Erythematous mucosa in the sigmoid colon. Biopsied.                        - The examination was otherwise normal on direct and                         retroflexion views. Recommendation:        - Discharge patient to home.                        - Resume previous diet.                        - Continue present medications.                        - Await pathology results.                        - Repeat colonoscopy in 1-2 years because the bowel                         preparation was suboptimal.                        - Return to referring physician as previously                         scheduled. Procedure Code(s):     --- Professional ---                        [phone], Colonoscopy, flexible; with biopsy, single or                         multiple Diagnosis Code(s):     ---  Professional ---                        Z12.11, Encounter for screening for malignant neoplasm                         of colon                        K63.89, Other specified diseases of intestine CPT copyright 2019 [employer]. All rights reserved. The codes documented in this report are preliminary and upon coder review may  be revised to meet current compliance requirements. Andrey Farmer MD, MD 05/20/2022 9:16:04 AM Number of Addenda: 0 Note Initiated On: 05/20/2022 8:25 AM Scope Withdrawal Time: 0 hours 10 minutes 29 seconds  Total Procedure Duration: 0 hours 14 minutes 51 seconds  Estimated Blood Loss:  Estimated blood loss was minimal.      [facility]

## 2022-05-20 NOTE — Interval H&P Note (Signed)
History and Physical Interval Note:  05/20/2022 8:36 AM  Cassandra Schwartz  has presented today for surgery, with the diagnosis of colon cancer screening.  The various methods of treatment have been discussed with the patient and family. After consideration of risks, benefits and other options for treatment, the patient has consented to  Procedure(s) with comments: COLONOSCOPY WITH PROPOFOL (N/A) - DM as a surgical intervention.  The patient's history has been reviewed, patient examined, no change in status, stable for surgery.  I have reviewed the patient's chart and labs.  Questions were answered to the patient's satisfaction.     Regis Bill  Ok to proceed with colonoscopy

## 2022-05-20 NOTE — Transfer of Care (Signed)
Immediate Anesthesia Transfer of Care Note  Patient: Cassandra Schwartz  Procedure(s) Performed: COLONOSCOPY WITH PROPOFOL  Patient Location: PACU  Anesthesia Type:General  Level of Consciousness: drowsy  Airway & Oxygen Therapy: Patient Spontanous Breathing  Post-op Assessment: Report given to RN and Post -op Vital signs reviewed and stable  Post vital signs: Reviewed and stable  Last Vitals:  Vitals Value Taken Time  BP 105/62   Temp    Pulse 57 05/20/22 0915  Resp 14 05/20/22 0915  SpO2 98 % 05/20/22 0915  Vitals shown include unvalidated device data.  Last Pain:  Vitals:   05/20/22 0835  TempSrc: Temporal  PainSc: 0-No pain         Complications: No notable events documented.

## 2022-05-20 NOTE — Anesthesia Preprocedure Evaluation (Signed)
Anesthesia Evaluation  Patient identified by MRN, date of birth, ID band Patient awake    Reviewed: Allergy & Precautions, NPO status , Patient's Chart, lab work & pertinent test results  History of Anesthesia Complications Negative for: history of anesthetic complications  Airway Mallampati: III  TM Distance: <3 FB Neck ROM: full    Dental  (+) Chipped   Pulmonary neg shortness of breath, sleep apnea , former smoker,    Pulmonary exam normal        Cardiovascular Exercise Tolerance: Good hypertension, (-) angina(-) Past MI Normal cardiovascular exam     Neuro/Psych PSYCHIATRIC DISORDERS negative neurological ROS     GI/Hepatic Neg liver ROS, GERD  Controlled,  Endo/Other  diabetes, Type 2  Renal/GU negative Renal ROS  negative genitourinary   Musculoskeletal   Abdominal   Peds  Hematology negative hematology ROS (+)   Anesthesia Other Findings Past Medical History: No date: Anemia No date: Arthritis No date: Depression No date: Diabetes mellitus without complication (HCC) No date: GERD (gastroesophageal reflux disease) No date: Hyperlipidemia No date: Hypertension No date: Pyelonephritis No date: Restless leg syndrome No date: Sleep apnea     Comment:  NO CPAP- PT HAD GASRTIC SLEEVE IN 2012 AND HAS NOT BEEN               RECHECKED   Past Surgical History: No date: APPENDECTOMY No date: COLONOSCOPY     Comment:  x2 No date: LAPAROSCOPIC GASTRIC SLEEVE RESECTION No date: ORIF HUMERUS FRACTURE No date: ORIF WRIST FRACTURE 06/09/2015: SHOULDER ARTHROSCOPY WITH OPEN ROTATOR CUFF REPAIR; Left     Comment:  Procedure: left shoulder arthroscopic debridement,               decompression, WITH OPEN ROTATOR CUFF REPAIR;  Surgeon:               Christena Flake, MD;  Location: [facility] ORS;  Service:               Orthopedics;  Laterality: Left; No date: TONSILLECTOMY     Reproductive/Obstetrics negative OB  ROS                             Anesthesia Physical Anesthesia Plan  ASA: 3  Anesthesia Plan: General   Post-op Pain Management:    Induction: Intravenous  PONV Risk Score and Plan: Propofol infusion and TIVA  Airway Management Planned: Natural Airway and Nasal Cannula  Additional Equipment:   Intra-op Plan:   Post-operative Plan:   Informed Consent: I have reviewed the patients History and Physical, chart, labs and discussed the procedure including the risks, benefits and alternatives for the proposed anesthesia with the patient or authorized representative who has indicated his/her understanding and acceptance.     Dental Advisory Given  Plan Discussed with: Anesthesiologist, CRNA and Surgeon  Anesthesia Plan Comments: (Patient consented for risks of anesthesia including but not limited to:  - adverse reactions to medications - risk of airway placement if required - damage to eyes, teeth, lips or other oral mucosa - nerve damage due to positioning  - sore throat or hoarseness - Damage to heart, brain, nerves, lungs, other parts of body or loss of life  Patient voiced understanding.)        Anesthesia Quick Evaluation

## 2022-05-23 LAB — SURGICAL PATHOLOGY

## 2022-06-21 ENCOUNTER — Ambulatory Visit
Admission: RE | Admit: 2022-06-21 | Discharge: 2022-06-21 | Disposition: A | Discharge status: Home / Self Care | Payer: Medicare Other | Source: Ambulatory Visit | Attending: Orthopedic Surgery | Admitting: Orthopedic Surgery

## 2022-06-21 ENCOUNTER — Other Ambulatory Visit: Payer: Self-pay | Admitting: Orthopedic Surgery

## 2022-06-21 DIAGNOSIS — S42291A Other displaced fracture of upper end of right humerus, initial encounter for closed fracture: Secondary | ICD-10-CM | POA: Insufficient documentation

## 2022-10-04 ENCOUNTER — Other Ambulatory Visit: Payer: Self-pay | Admitting: Physician Assistant

## 2022-10-04 DIAGNOSIS — R413 Other amnesia: Secondary | ICD-10-CM

## 2022-10-11 ENCOUNTER — Ambulatory Visit
Admission: RE | Admit: 2022-10-11 | Discharge: 2022-10-11 | Disposition: A | Discharge status: Home / Self Care | Payer: Medicare Other | Source: Ambulatory Visit | Attending: Physician Assistant | Admitting: Physician Assistant

## 2022-10-11 DIAGNOSIS — R413 Other amnesia: Secondary | ICD-10-CM

## 2022-10-11 MED ORDER — GADOPICLENOL 0.5 MMOL/ML IV SOLN
7.5000 mL | Freq: Once | INTRAVENOUS | Status: AC | PRN
Start: 2022-10-11 — End: 2022-10-11
  Administered 2022-10-11: 7.5 mL via INTRAVENOUS

## 2022-10-18 ENCOUNTER — Other Ambulatory Visit: Payer: Medicare Other
